# Patient Record
Sex: Female | Born: 1960 | Race: White | Hispanic: No | Marital: Married | Smoking: Current every day smoker
Health system: Southern US, Community
[De-identification: ages and names within clinical notes are randomized; demographics above are authoritative.]

## PROBLEM LIST (undated history)

## (undated) DIAGNOSIS — E785 Hyperlipidemia, unspecified: Secondary | ICD-10-CM

## (undated) DIAGNOSIS — J45909 Unspecified asthma, uncomplicated: Secondary | ICD-10-CM

## (undated) HISTORY — PX: NO PAST SURGERIES: SHX2092

## (undated) HISTORY — DX: Unspecified asthma, uncomplicated: J45.909

---

## 2018-11-11 ENCOUNTER — Encounter: Payer: Self-pay | Admitting: Emergency Medicine

## 2018-11-11 ENCOUNTER — Ambulatory Visit
Admission: EM | Admit: 2018-11-11 | Discharge: 2018-11-11 | Disposition: A | Discharge status: Home / Self Care | Payer: BLUE CROSS/BLUE SHIELD | Attending: Family Medicine | Admitting: Family Medicine

## 2018-11-11 ENCOUNTER — Other Ambulatory Visit: Payer: Self-pay

## 2018-11-11 DIAGNOSIS — J441 Chronic obstructive pulmonary disease with (acute) exacerbation: Secondary | ICD-10-CM

## 2018-11-11 DIAGNOSIS — J069 Acute upper respiratory infection, unspecified: Secondary | ICD-10-CM

## 2018-11-11 DIAGNOSIS — B9789 Other viral agents as the cause of diseases classified elsewhere: Secondary | ICD-10-CM

## 2018-11-11 HISTORY — DX: Hyperlipidemia, unspecified: E78.5

## 2018-11-11 MED ORDER — ALBUTEROL SULFATE HFA 108 (90 BASE) MCG/ACT IN AERS: 2.0000 | INHALATION_SPRAY | RESPIRATORY_TRACT | 0 refills | Status: DC | PRN

## 2018-11-11 MED ORDER — DOXYCYCLINE HYCLATE 100 MG PO CAPS: 100.0000 mg | ORAL_CAPSULE | Freq: Two times a day (BID) | ORAL | 0 refills | Status: DC

## 2018-11-11 MED ORDER — PREDNISONE 10 MG PO TABS: ORAL_TABLET | ORAL | 0 refills | Status: DC

## 2018-11-11 NOTE — Discharge Instructions (Addendum)
Take medication as prescribed. Rest. Drink plenty of fluids.   Follow up with your primary care physician this week as needed discussed Return to Urgent care for new or worsening concerns.

## 2018-11-11 NOTE — ED Triage Notes (Signed)
Patient c/o cough and chest congestion and runny nose off and on since October.  Patient denies fevers.

## 2018-11-11 NOTE — ED Provider Notes (Signed)
[facility] ____________________________________________  Time seen: Approximately 5:00 PM  I have reviewed the triage vital signs and the nursing notes.   HISTORY  Chief Complaint Cough   HPI Erin Stanton is a 57 y.o. female past medical history of asthma, COPD and hyperlipidemia presenting for evaluation of 1.5 weeks of cough and congestion complaints.  Patient states that her son came home for Thanksgiving and had a cold which she feels that she got sick from him.  Reports having nasal congestion, nasal drainage, productive cough with intermittent wheezing.  States she just ran out of her albuterol inhaler.  Denies any accompanying fevers.  Of note, patient also reports that she had a lasting cough from mid October 2 beginning of November.  States she was seen by her doctor and was treated with prednisone as well as a Z-Pak, and reports after that she had 2 to 3 weeks of symptom complete resolution.  States she did recover from previous and is now sick again.  Has tried some over-the-counter congestion medications without resolution.  Continues to eat and drink well.  Denies chest pain, shortness of breath, extremity swelling.  No accompanying fevers.  Denies other aggravating alleviating factors.  Reports otherwise doing well.  PCP: Basilia Jumbo   Past Medical History:  Diagnosis Date  . Hyperlipidemia     There are no active problems to display for this patient.   History reviewed. No pertinent surgical history.   No current facility-administered medications for this encounter.   Current Outpatient Medications:  .  albuterol (PROVENTIL HFA;VENTOLIN HFA) 108 (90 Base) MCG/ACT inhaler, Inhale 1-2 puffs into the lungs every 6 (six) hours as needed for wheezing or shortness of breath., Disp: , Rfl:  .  albuterol (PROVENTIL HFA;VENTOLIN HFA) 108 (90 Base) MCG/ACT inhaler, Inhale 2 puffs into the lungs every 4 (four) hours as needed., Disp: 1 Inhaler, Rfl: 0 .   doxycycline (VIBRAMYCIN) 100 MG capsule, Take 1 capsule (100 mg total) by mouth 2 (two) times daily., Disp: 20 capsule, Rfl: 0 .  pravastatin (PRAVACHOL) 10 MG tablet, Take 10 mg by mouth at bedtime., Disp: , Rfl: 1 .  predniSONE (DELTASONE) 10 MG tablet, Start 60 mg po day one, then 50 mg po day two, taper by 10 mg daily until complete., Disp: 21 tablet, Rfl: 0  Allergies Ceftin [cefuroxime]  Family History  Family history unknown: Yes    Social History Social History   Tobacco Use  . Smoking status: Current Every Day Smoker    Types: Cigarettes  . Smokeless tobacco: Never Used  Substance Use Topics  . Alcohol use: Yes  . Drug use: Never    Review of Systems Constitutional: No fever ENT: Initially had a sore throat that has since resolved and now only present with coughing. Cardiovascular: Denies chest pain. Respiratory: Denies shortness of breath. Gastrointestinal: No abdominal pain.  Musculoskeletal: Negative for back pain. Skin: Negative for rash.   ____________________________________________   PHYSICAL EXAM:  VITAL SIGNS: ED Triage Vitals  Enc Vitals Group     BP 11/11/18 1629 121/72     Pulse Rate 11/11/18 1629 78     Resp 11/11/18 1629 16     Temp 11/11/18 1629 98.4 F (36.9 C)     Temp Source 11/11/18 1629 Oral     SpO2 11/11/18 1629 96 %     Weight 11/11/18 1626 120 lb (54.4 kg)     Height 11/11/18 1626 5\' 8"  (1.727 m)     Head Circumference --  Peak Flow --      Pain Score 11/11/18 1626 0     Pain Loc --      Pain Edu? --      Excl. in GC? --     Constitutional: Alert and oriented. Well appearing and in no acute distress. Eyes: Conjunctivae are normal.  Head: Atraumatic. No sinus tenderness to palpation. No swelling. No erythema.  Ears: no erythema, normal TMs bilaterally.   Nose:Nasal congestion  Mouth/Throat: Mucous membranes are moist. No pharyngeal erythema. No tonsillar swelling or exudate.  Neck: No stridor.  No cervical spine  tenderness to palpation. Hematological/Lymphatic/Immunilogical: No cervical lymphadenopathy. Cardiovascular: Normal rate, regular rhythm. Grossly normal heart sounds.  Good peripheral circulation. Respiratory: Normal respiratory effort.  No retractions.  Mild scattered expiratory wheezes.  Mild scattered rhonchi.  Speaks in complete sentences.  Occasional cough in room.  Good air movement.  Musculoskeletal: Ambulatory with steady gait.  No lower extremity edema noted. Neurologic:  Normal speech and language. No gait instability. Skin:  Skin appears warm, dry and intact. No rash noted. Psychiatric: Mood and affect are normal. Speech and behavior are normal.  ___________________________________________   LABS (all labs ordered are listed, but only abnormal results are displayed)  Labs Reviewed - No data to display  PROCEDURES Procedures   INITIAL IMPRESSION / ASSESSMENT AND PLAN / ED COURSE  Pertinent labs & imaging results that were available during my care of the patient were reviewed by me and considered in my medical decision making (see chart for details).  Well-appearing patient.  No acute distress.  Suspect recent viral upper respiratory infection with COPD exacerbation.  Will treat with oral doxycycline, prednisone taper and refill albuterol inhaler.  Encouraged fluids, rest, supportive care as well as discussed strict follow-up and return parameters, follow-up with primary care in 1 to 2 weeks, sooner if no improvement.Discussed indication, risks and benefits of medications with patient.  Discussed follow up and return parameters including no resolution or any worsening concerns. Patient verbalized understanding and agreed to plan.   ____________________________________________   FINAL CLINICAL IMPRESSION(S) / ED DIAGNOSES  Final diagnoses:  COPD exacerbation (HCC)  Viral URI with cough     ED Discharge Orders         Ordered    albuterol (PROVENTIL HFA;VENTOLIN HFA)  108 (90 Base) MCG/ACT inhaler  Every 4 hours PRN     11/11/18 1650    doxycycline (VIBRAMYCIN) 100 MG capsule  2 times daily     11/11/18 1650    predniSONE (DELTASONE) 10 MG tablet     11/11/18 1650           Note: This dictation was prepared with Dragon dictation along with smaller phrase technology. Any transcriptional errors that result from this process are unintentional.         Renford DillsMiller, Devaney Segers, NP 11/11/18 1705

## 2019-09-04 ENCOUNTER — Other Ambulatory Visit: Payer: Self-pay | Admitting: Medical Oncology

## 2019-09-04 DIAGNOSIS — Z1231 Encounter for screening mammogram for malignant neoplasm of breast: Secondary | ICD-10-CM

## 2019-09-14 ENCOUNTER — Inpatient Hospital Stay: Admission: RE | Admit: 2019-09-14 | Payer: BLUE CROSS/BLUE SHIELD | Source: Ambulatory Visit

## 2019-10-04 ENCOUNTER — Ambulatory Visit: Payer: BC Managed Care – PPO

## 2019-12-19 ENCOUNTER — Encounter (INDEPENDENT_AMBULATORY_CARE_PROVIDER_SITE_OTHER): Payer: Self-pay

## 2019-12-19 ENCOUNTER — Other Ambulatory Visit: Payer: Self-pay

## 2019-12-19 ENCOUNTER — Ambulatory Visit
Admission: RE | Admit: 2019-12-19 | Discharge: 2019-12-19 | Disposition: A | Discharge status: Home / Self Care | Payer: BC Managed Care – PPO | Source: Ambulatory Visit | Attending: Medical Oncology | Admitting: Medical Oncology

## 2019-12-19 DIAGNOSIS — Z1231 Encounter for screening mammogram for malignant neoplasm of breast: Secondary | ICD-10-CM

## 2020-10-12 ENCOUNTER — Encounter: Payer: Self-pay | Admitting: Emergency Medicine

## 2020-10-12 ENCOUNTER — Ambulatory Visit
Admission: EM | Admit: 2020-10-12 | Discharge: 2020-10-12 | Disposition: A | Discharge status: Home / Self Care | Payer: BC Managed Care – PPO | Attending: Family Medicine | Admitting: Family Medicine

## 2020-10-12 ENCOUNTER — Ambulatory Visit (INDEPENDENT_AMBULATORY_CARE_PROVIDER_SITE_OTHER): Payer: BC Managed Care – PPO

## 2020-10-12 ENCOUNTER — Other Ambulatory Visit: Payer: Self-pay

## 2020-10-12 DIAGNOSIS — Z20822 Contact with and (suspected) exposure to covid-19: Secondary | ICD-10-CM | POA: Diagnosis not present

## 2020-10-12 DIAGNOSIS — Z7982 Long term (current) use of aspirin: Secondary | ICD-10-CM | POA: Diagnosis not present

## 2020-10-12 DIAGNOSIS — R509 Fever, unspecified: Secondary | ICD-10-CM

## 2020-10-12 DIAGNOSIS — F17210 Nicotine dependence, cigarettes, uncomplicated: Secondary | ICD-10-CM | POA: Diagnosis not present

## 2020-10-12 DIAGNOSIS — Z79899 Other long term (current) drug therapy: Secondary | ICD-10-CM | POA: Insufficient documentation

## 2020-10-12 DIAGNOSIS — J441 Chronic obstructive pulmonary disease with (acute) exacerbation: Secondary | ICD-10-CM | POA: Diagnosis not present

## 2020-10-12 DIAGNOSIS — R059 Cough, unspecified: Secondary | ICD-10-CM

## 2020-10-12 DIAGNOSIS — F17200 Nicotine dependence, unspecified, uncomplicated: Secondary | ICD-10-CM

## 2020-10-12 DIAGNOSIS — Z881 Allergy status to other antibiotic agents status: Secondary | ICD-10-CM | POA: Insufficient documentation

## 2020-10-12 MED ORDER — PREDNISONE 50 MG PO TABS: ORAL_TABLET | ORAL | 0 refills | Status: DC

## 2020-10-12 MED ORDER — HYDROCOD POLST-CPM POLST ER 10-8 MG/5ML PO SUER: 5.0000 mL | Freq: Two times a day (BID) | ORAL | 0 refills | Status: DC | PRN

## 2020-10-12 MED ORDER — ALBUTEROL SULFATE HFA 108 (90 BASE) MCG/ACT IN AERS: 1.0000 | INHALATION_SPRAY | Freq: Four times a day (QID) | RESPIRATORY_TRACT | 0 refills | Status: AC | PRN

## 2020-10-12 MED ORDER — AZITHROMYCIN 250 MG PO TABS: ORAL_TABLET | ORAL | 0 refills | Status: DC

## 2020-10-12 NOTE — ED Triage Notes (Signed)
Patient c/o cough and chest congestion for the past 4 days.  Patient also reports runny nose.  Patient denies recent fevers.

## 2020-10-12 NOTE — ED Provider Notes (Signed)
[facility]    CSN: [id number] Arrival date & time: 10/12/20  1239      History   Chief Complaint Chief Complaint  Patient presents with  . Cough   HPI   59 year old female presents with respiratory symptoms.  4 day history of symptoms. Reports productive cough and congestion. Has had fever as well, T-max 101.6.  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  No relieving factors.  She is most troubled by the cough.  She has known COPD as documented in the electronic medical record.  She states that she is out of her albuterol inhaler.  No other associated symptoms.  No other complaints.  Past Medical History:  Diagnosis Date  . Hyperlipidemia    Home Medications    Prior to Admission medications   Medication Sig Start Date End Date Taking? Authorizing Provider  aspirin 81 MG EC tablet Take by mouth.   Yes [provider]  pravastatin (PRAVACHOL) 10 MG tablet Take 10 mg by mouth at bedtime. 10/04/18  Yes [provider]  albuterol (VENTOLIN HFA) 108 (90 Base) MCG/ACT inhaler Inhale 1-2 puffs into the lungs every 6 (six) hours as needed for wheezing or shortness of breath. 10/12/20   Tommie Sams, DO  azithromycin (ZITHROMAX) 250 MG tablet 2 tablets on day 1, then 1 tablet daily on days 2-5. 10/12/20   Tommie Sams, DO  chlorpheniramine-HYDROcodone (TUSSIONEX PENNKINETIC ER) 10-8 MG/5ML SUER Take 5 mLs by mouth every 12 (twelve) hours as needed for cough. 10/12/20   Tommie Sams, DO  predniSONE (DELTASONE) 50 MG tablet 1 tablet daily x 5 days 10/12/20   Tommie Sams, DO    Family History Family History  Problem Relation Age of Onset  . Breast cancer Father   . Breast cancer Sister   . Breast cancer Paternal Aunt   . Breast cancer Cousin     Social History Social History   Tobacco Use  . Smoking status: Current Every Day Smoker    Types: Cigarettes  . Smokeless tobacco: Never Used  Vaping Use  . Vaping Use: Never used    Substance Use Topics  . Alcohol use: Yes  . Drug use: Never     Allergies   Ceftin [cefuroxime]   Review of Systems Review of Systems  Constitutional: Positive for fever.  HENT: Positive for congestion.   Respiratory: Positive for cough.    Physical Exam Triage Vital Signs ED Triage Vitals  Enc Vitals Group     BP 10/12/20 1316 133/79     Pulse Rate 10/12/20 1316 100     Resp 10/12/20 1316 16     Temp 10/12/20 1316 98.7 F (37.1 C)     Temp Source 10/12/20 1316 Oral     SpO2 10/12/20 1316 98 %     Weight 10/12/20 1312 120 lb (54.4 kg)     Height 10/12/20 1312 5\' 8"  (1.727 m)     Head Circumference --      Peak Flow --      Pain Score 10/12/20 1311 0     Pain Loc --      Pain Edu? --      Excl. in GC? --    Updated Vital Signs BP 133/79 (BP Location: Right Arm)   Pulse 100   Temp 98.7 F (37.1 C) (Oral)   Resp 16   Ht 5\' 8"  (1.727 m)   Wt 54.4 kg  SpO2 98%   BMI 18.25 kg/m   Visual Acuity Right Eye Distance:   Left Eye Distance:   Bilateral Distance:    Right Eye Near:   Left Eye Near:    Bilateral Near:     Physical Exam Vitals and nursing note reviewed.  Constitutional:      General: She is not in acute distress.    Appearance: Normal appearance. She is not ill-appearing.  HENT:     Head: Normocephalic and atraumatic.  Eyes:     General:        Right eye: No discharge.        Left eye: No discharge.     Conjunctiva/sclera: Conjunctivae normal.  Cardiovascular:     Rate and Rhythm: Regular rhythm. Tachycardia present.  Pulmonary:     Effort: Pulmonary effort is normal.     Breath sounds: Normal breath sounds. No wheezing or rales.  Neurological:     Mental Status: She is alert.  Psychiatric:        Mood and Affect: Mood normal.        Behavior: Behavior normal.    UC Treatments / Results  Labs (all labs ordered are listed, but only abnormal results are displayed) Labs Reviewed  SARS CORONAVIRUS 2 (TAT 6-24 HRS)     EKG   Radiology DG Chest 2 View  Result Date: 10/12/2020 CLINICAL DATA:  Cough, fever, current smoker EXAM: CHEST - 2 VIEW COMPARISON:  None. FINDINGS: Normal heart size. Normal mediastinal contour. No pneumothorax. No pleural effusion. Mild symmetric biapical pleural-parenchymal scarring. No pulmonary edema. No acute consolidative airspace disease. IMPRESSION: No active cardiopulmonary disease. Electronically Signed   By: Delbert Phenix M.D.   On: 10/12/2020 14:33    Procedures Procedures (including critical care time)  Medications Ordered in UC Medications - No data to display  Initial Impression / Assessment and Plan / UC Course  I have reviewed the triage vital signs and the nursing notes.  Pertinent labs & imaging results that were available during my care of the patient were reviewed by me and considered in my medical decision making (see chart for details).    59 year old female presents with COPD exacerbation.  Chest x-ray was obtained and was independently reviewed by me.  No acute infiltrate.  Treating with prednisone, albuterol, azithromycin.  Tussionex as needed for cough.  Final Clinical Impressions(s) / UC Diagnoses   Final diagnoses:  COPD exacerbation [facility])   Discharge Instructions   None    ED Prescriptions    Medication Sig Dispense Auth. Provider   predniSONE (DELTASONE) 50 MG tablet 1 tablet daily x 5 days 5 tablet Anthem Frazer G, DO   albuterol (VENTOLIN HFA) 108 (90 Base) MCG/ACT inhaler Inhale 1-2 puffs into the lungs every 6 (six) hours as needed for wheezing or shortness of breath. 18 g Krishawn Vanderweele G, DO   azithromycin (ZITHROMAX) 250 MG tablet 2 tablets on day 1, then 1 tablet daily on days 2-5. 6 tablet Dorenda Pfannenstiel G, DO   chlorpheniramine-HYDROcodone (TUSSIONEX PENNKINETIC ER) 10-8 MG/5ML SUER Take 5 mLs by mouth every 12 (twelve) hours as needed for cough. 115 mL Tommie Sams, DO     PDMP not reviewed this encounter.   Tommie Sams,  [place] 10/12/20 1546

## 2020-10-13 LAB — SARS CORONAVIRUS 2 (TAT 6-24 HRS): SARS Coronavirus 2: NEGATIVE

## 2021-01-24 ENCOUNTER — Other Ambulatory Visit: Payer: Self-pay

## 2021-01-24 DIAGNOSIS — Z1231 Encounter for screening mammogram for malignant neoplasm of breast: Secondary | ICD-10-CM

## 2021-02-18 ENCOUNTER — Other Ambulatory Visit: Payer: Self-pay | Admitting: Family Medicine

## 2021-02-24 ENCOUNTER — Other Ambulatory Visit: Payer: Self-pay

## 2021-02-24 ENCOUNTER — Ambulatory Visit
Admission: RE | Admit: 2021-02-24 | Discharge: 2021-02-24 | Disposition: A | Discharge status: Home / Self Care | Payer: BC Managed Care – PPO | Source: Ambulatory Visit

## 2021-02-24 DIAGNOSIS — Z1231 Encounter for screening mammogram for malignant neoplasm of breast: Secondary | ICD-10-CM | POA: Diagnosis present

## 2021-03-06 ENCOUNTER — Other Ambulatory Visit: Payer: Self-pay | Admitting: Family Medicine

## 2021-03-08 ENCOUNTER — Other Ambulatory Visit: Payer: Self-pay | Admitting: Family Medicine

## 2021-03-13 ENCOUNTER — Encounter: Payer: Self-pay | Admitting: Obstetrics and Gynecology

## 2021-03-13 ENCOUNTER — Ambulatory Visit (INDEPENDENT_AMBULATORY_CARE_PROVIDER_SITE_OTHER): Payer: BC Managed Care – PPO | Admitting: Obstetrics and Gynecology

## 2021-03-13 ENCOUNTER — Other Ambulatory Visit: Payer: Self-pay

## 2021-03-13 ENCOUNTER — Other Ambulatory Visit (HOSPITAL_COMMUNITY)
Admission: RE | Admit: 2021-03-13 | Discharge: 2021-03-13 | Disposition: A | Discharge status: Home / Self Care | Payer: BC Managed Care – PPO | Source: Ambulatory Visit | Attending: Obstetrics and Gynecology | Admitting: Obstetrics and Gynecology

## 2021-03-13 VITALS — BP 113/72 | Ht 68.0 in | Wt 124.0 lb

## 2021-03-13 DIAGNOSIS — Z1339 Encounter for screening examination for other mental health and behavioral disorders: Secondary | ICD-10-CM

## 2021-03-13 DIAGNOSIS — Z1331 Encounter for screening for depression: Secondary | ICD-10-CM | POA: Diagnosis not present

## 2021-03-13 DIAGNOSIS — Z124 Encounter for screening for malignant neoplasm of cervix: Secondary | ICD-10-CM | POA: Diagnosis present

## 2021-03-13 DIAGNOSIS — Z01419 Encounter for gynecological examination (general) (routine) without abnormal findings: Secondary | ICD-10-CM | POA: Insufficient documentation

## 2021-03-13 DIAGNOSIS — Z803 Family history of malignant neoplasm of breast: Secondary | ICD-10-CM

## 2021-03-13 NOTE — Progress Notes (Signed)
Routine Annual Gynecology Examination   PCP: D, Ples Specter, FNP  Chief Complaint  Patient presents with  . Annual Exam   History of Present Illness: Patient is a 60 y.o. G1P1001 presents for annual exam. The patient has no complaints today.   Menopausal bleeding: denies  Menopausal symptoms: denies  Breast symptoms: denies  Last pap smear: 03/2015 years ago.  Result ASCUS, no HPV performed  Last mammogram: 02/24/2021 - BiRads 1  She states that she has had BRCA testing due to family history on her father's side of the family.  She believes the testing was done in 2012.    Past Medical History:  Diagnosis Date  . Asthma    flare up when gets sick  . Hyperlipidemia     Past Surgical History:  Procedure Laterality Date  . NO PAST SURGERIES      Prior to Admission medications   Medication Sig Start Date End Date Taking? Authorizing Provider  albuterol (VENTOLIN HFA) 108 (90 Base) MCG/ACT inhaler Inhale 1-2 puffs into the lungs every 6 (six) hours as needed for wheezing or shortness of breath. 10/12/20  Yes Cook, Jayce G, DO  pravastatin (PRAVACHOL) 10 MG tablet Take 10 mg by mouth at bedtime. 10/04/18  Yes [provider]    Allergies  Allergen Reactions  . Ceftin [Cefuroxime] Anaphylaxis    Obstetric History: G1P1001  Social History   Socioeconomic History  . Marital status: Married    Spouse name: Not on file  . Number of children: Not on file  . Years of education: Not on file  . Highest education level: Not on file  Occupational History  . Not on file  Tobacco Use  . Smoking status: Current Every Day Smoker    Packs/day: 1.00    Years: 41.00    Pack years: 41.00    Types: Cigarettes  . Smokeless tobacco: Never Used  Vaping Use  . Vaping Use: Never used  Substance and Sexual Activity  . Alcohol use: Yes    Comment: 1 per day  . Drug use: Never  . Sexual activity: Yes    Birth control/protection: Post-menopausal  Other Topics Concern   . Not on file  Social History Narrative  . Not on file   Social Determinants of Health   Financial Resource Strain: Not on file  Food Insecurity: Not on file  Transportation Needs: Not on file  Physical Activity: Not on file  Stress: Not on file  Social Connections: Not on file  Intimate Partner Violence: Not on file    Family History  Problem Relation Age of Onset  . Breast cancer Father   . Breast cancer Sister   . Breast cancer Paternal Aunt   . Ovarian cancer Paternal Aunt   . Breast cancer Cousin   . Diabetes Sister   . Breast cancer Half-Sister     Review of Systems  Constitutional: Negative.   HENT: Negative.   Eyes: Negative.   Respiratory: Negative.   Cardiovascular: Negative.   Gastrointestinal: Negative.   Genitourinary: Negative.   Musculoskeletal: Negative.   Skin: Negative.   Neurological: Negative.   Psychiatric/Behavioral: Negative.      Physical Exam Vitals: BP 113/72   Ht 5' 8"  (1.727 m)   Wt 124 lb (56.2 kg)   BMI 18.85 kg/m   Physical Exam Constitutional:      General: She is not in acute distress.    Appearance: Normal appearance. She is well-developed.  Genitourinary:  Vulva and bladder normal.     Right Labia: No rash, tenderness, lesions, skin changes or Bartholin's cyst.    Left Labia: No tenderness, lesions, skin changes, Bartholin's cyst or rash.    No inguinal adenopathy present in the right or left side.    Pelvic Tanner Score: 5/5.    No vaginal discharge, erythema, tenderness or bleeding.      Right Adnexa: not tender, not full and no mass present.    Left Adnexa: not tender, not full and no mass present.    No cervical motion tenderness, discharge, lesion or polyp.     Uterus is not enlarged or tender.     No uterine mass detected.    Pelvic exam was performed with patient in the lithotomy position.  HENT:     Head: Normocephalic and atraumatic.  Eyes:     General: No scleral icterus.    Conjunctiva/sclera:  Conjunctivae normal.  Neck:     Thyroid: No thyromegaly.  Cardiovascular:     Rate and Rhythm: Normal rate and regular rhythm.     Heart sounds: No murmur heard. No friction rub. No gallop.   Pulmonary:     Effort: Pulmonary effort is normal. No respiratory distress.     Breath sounds: Normal breath sounds. No wheezing or rales.  Abdominal:     General: Bowel sounds are normal. There is no distension.     Palpations: Abdomen is soft. There is no mass.     Tenderness: There is no abdominal tenderness. There is no guarding or rebound.     Hernia: There is no hernia in the left inguinal area or right inguinal area.  Musculoskeletal:        General: No swelling or tenderness. Normal range of motion.     Cervical back: Normal range of motion and neck supple.  Lymphadenopathy:     Cervical: No cervical adenopathy.     Lower Body: No right inguinal adenopathy. No left inguinal adenopathy.  Neurological:     General: No focal deficit present.     Mental Status: She is alert and oriented to person, place, and time.     Cranial Nerves: No cranial nerve deficit.  Skin:    General: Skin is warm and dry.     Findings: No erythema or rash.  Psychiatric:        Mood and Affect: Mood normal.        Behavior: Behavior normal.        Judgment: Judgment normal.    breast exam deferred by patient. She states that her PCP did this exam recently.   Female chaperone present for pelvic and breast  portions of the physical exam  Tyrer-Cuzik = 21.9%  Assessment and Plan:  60 y.o. G38P1001 female here for routine annual gynecologic examination  Plan: Problem List Items Addressed This Visit      Other   Family history of breast cancer    Other Visit Diagnoses    Women's annual routine gynecological examination    -  Primary   Relevant Orders   Cytology - PAP   Screening for depression       Screening for alcoholism       Pap smear for cervical cancer screening       Relevant Orders    Cytology - PAP     Screening: -- Blood pressure screen normal -- Colonoscopy - due - managed by PCP -- Mammogram - not due -- Weight screening: normal --  Depression screening negative (PHQ-9) -- Nutrition: normal -- cholesterol screening: per PCP -- osteoporosis screening: not due -- tobacco screening: using: discussed quitting using the 5 A's -- alcohol screening: AUDIT questionnaire indicates low-risk usage. -- family history of breast cancer screening: done. not at high risk. -- no evidence of domestic violence or intimate partner violence. -- STD screening: gonorrhea/chlamydia NAAT not collected per patient request. -- pap smear collected per ASCCP guidelines -- Lung cancer screening: s/p chest CT  Family history of breast cancer on her father's side: we discussed update testing, given how long it has been since her last test.  She will consider.  We discussed her Mat [facility]ne results and the fact that this will change recommendations for surveillance.  I provided her a copy of her results so that she may discuss these with her primary care provider who is performing her breast exams.  If she would like for me to manage this, I would be happy to do so.   Prentice Docker, MD 03/13/2021 5:21 PM

## 2021-03-18 LAB — CYTOLOGY - PAP
Comment: NEGATIVE
Diagnosis: NEGATIVE
High risk HPV: NEGATIVE

## 2021-03-27 ENCOUNTER — Encounter: Payer: Self-pay | Admitting: Obstetrics and Gynecology

## 2021-12-04 IMAGING — MG DIGITAL SCREENING BILAT W/ TOMO W/ CAD
8 series · 9 of 24 positions shown · non-contrast
Comparison: Previous exam(s).

CLINICAL DATA: Screening.

EXAM:
DIGITAL SCREENING BILATERAL MAMMOGRAM WITH TOMO AND CAD

[L CC synth-2D]
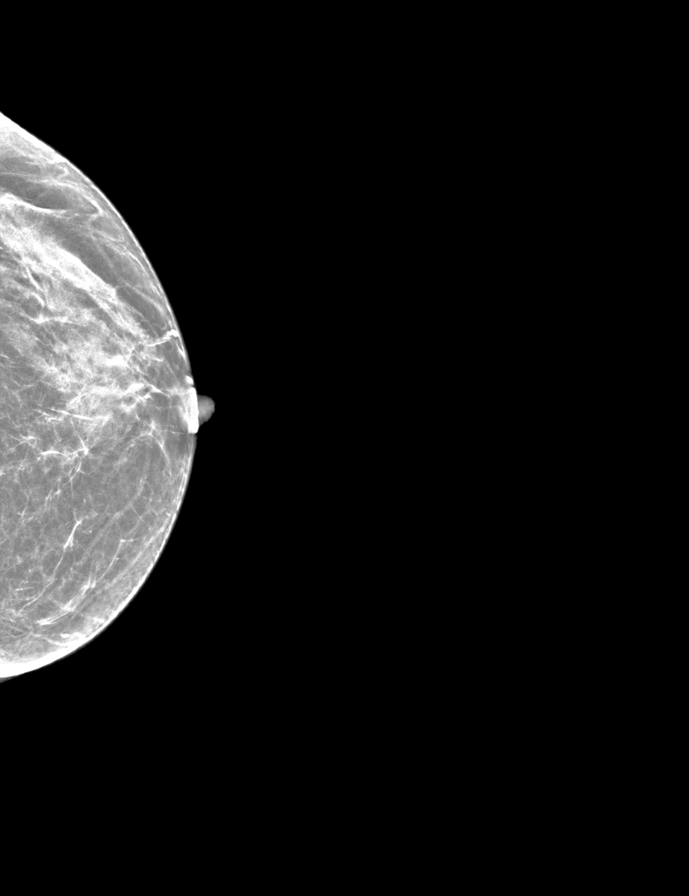

[L MLO synth-2D]
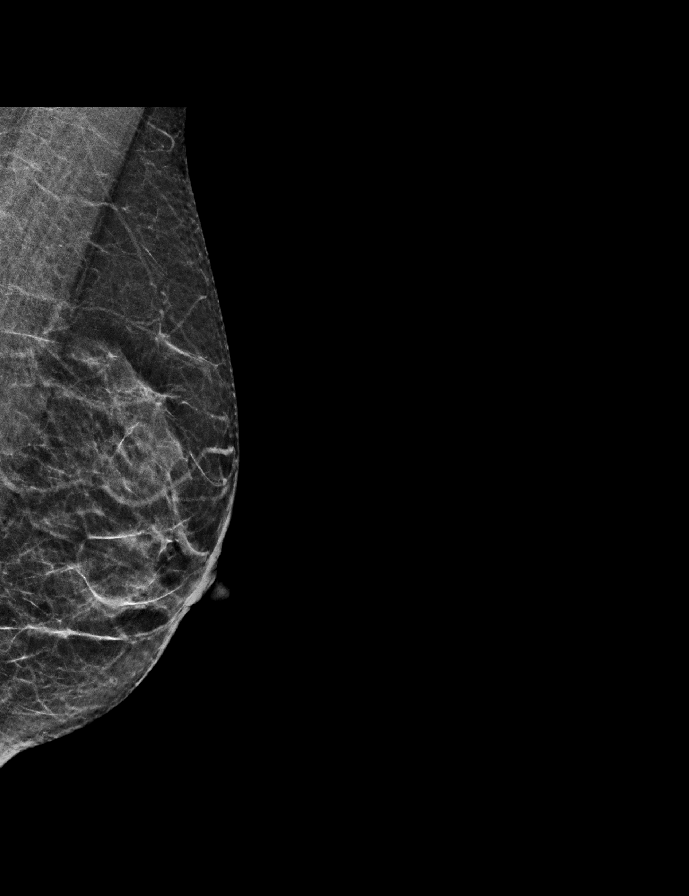

[R MLO synth-2D]
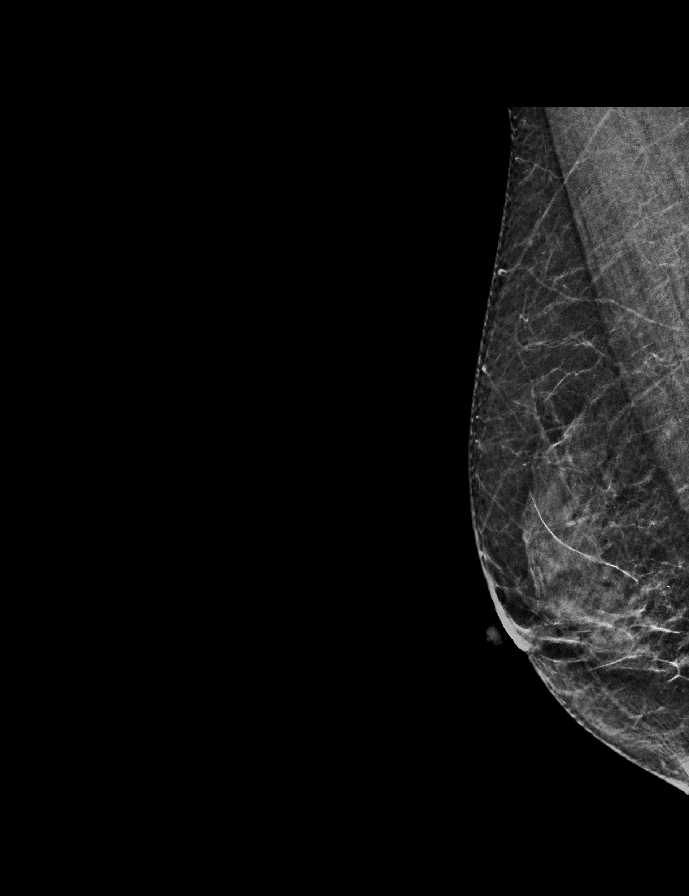

[R CC synth-2D]
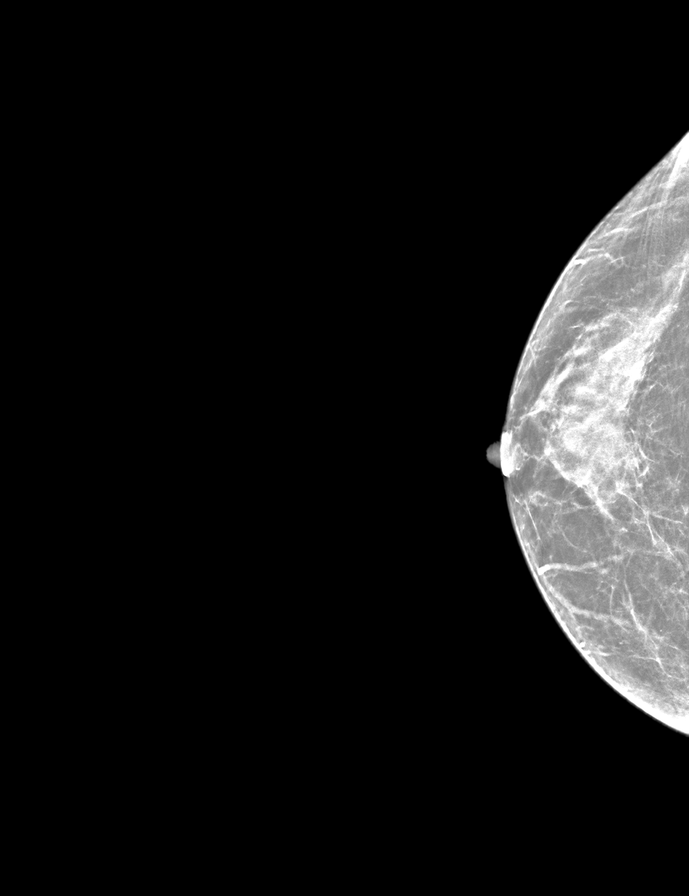

[R CC tomo · 2 of 51 frames shown]
[frame 17/51]
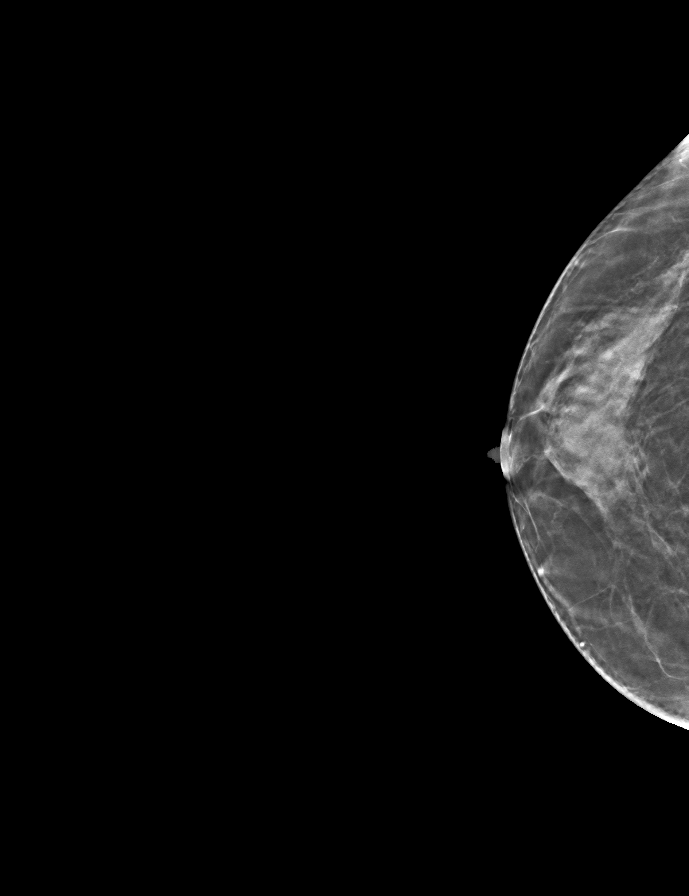
[frame 26/51]
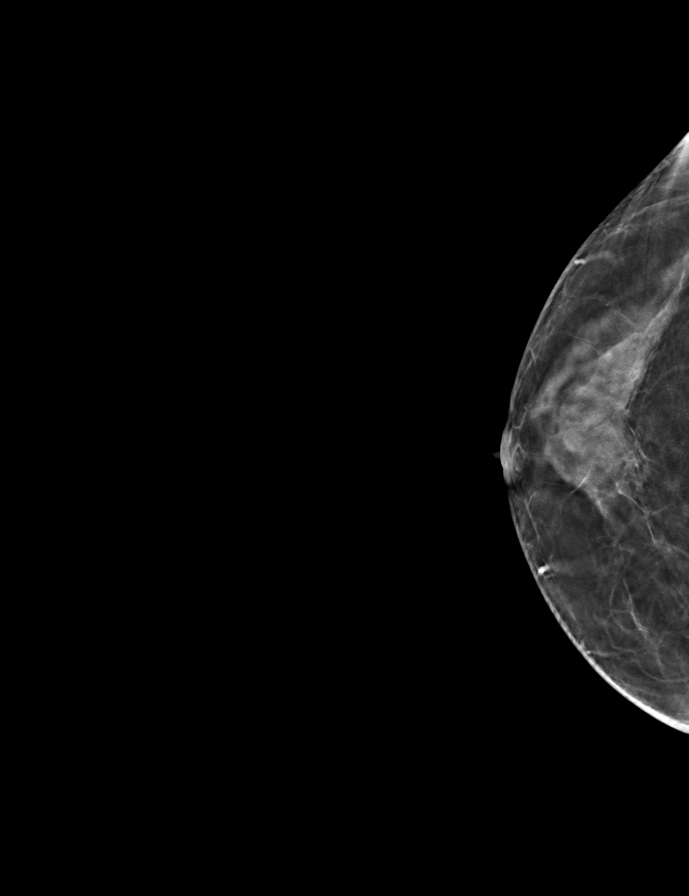

[L CC tomo · tomo slice 24/47.0]
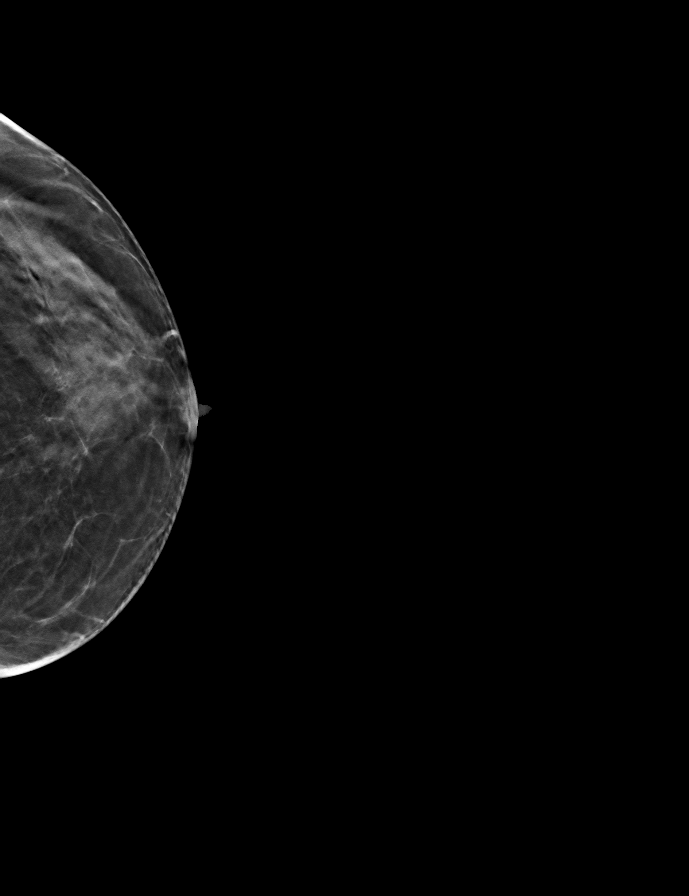

[R MLO tomo · tomo slice 23/44.0]
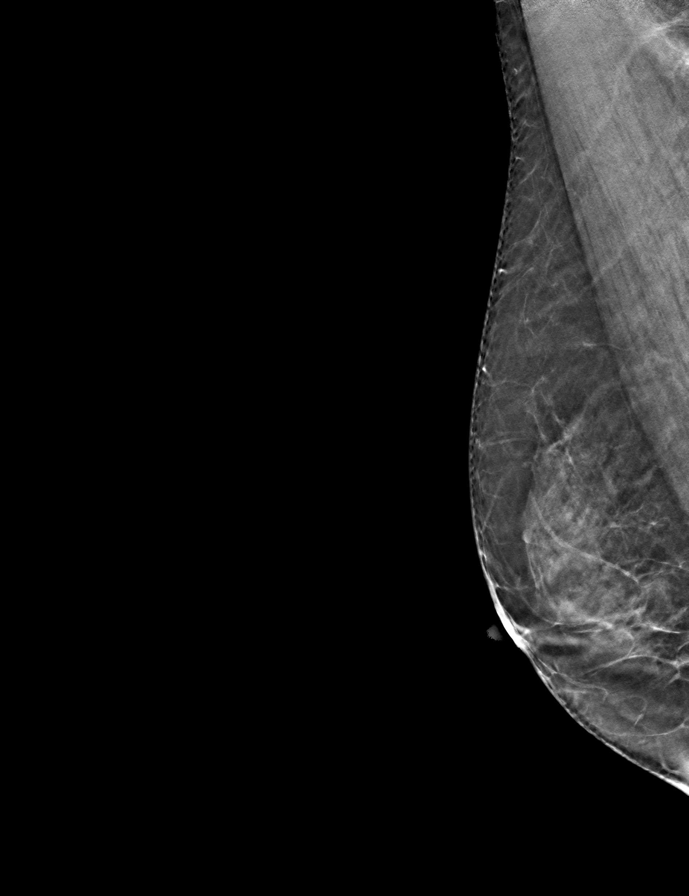

[L MLO tomo · tomo slice 23/46.0]
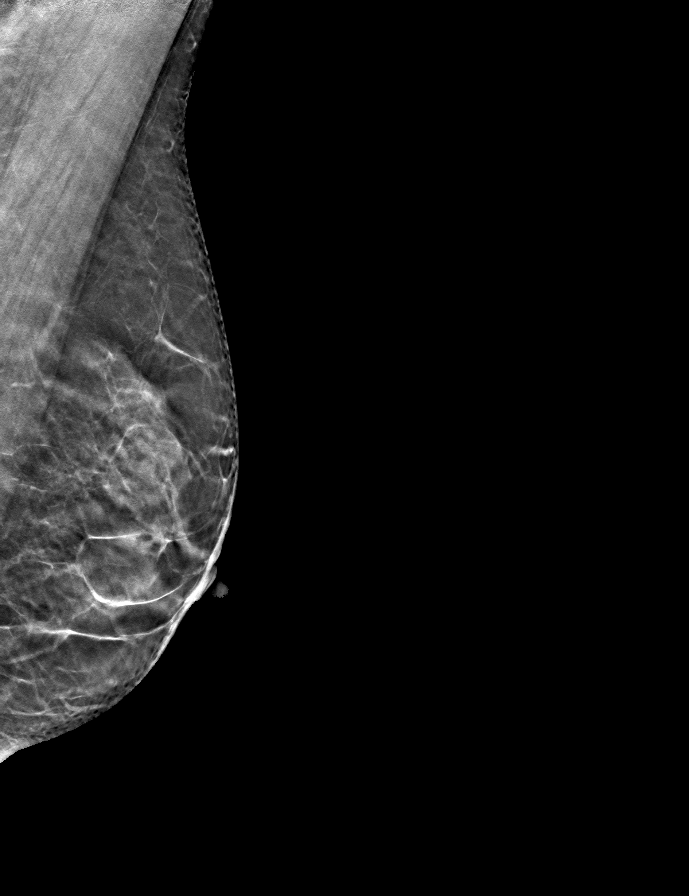

[9 of 24 positions shown; findings below may reference images not displayed]

ACR Breast Density Category c: The breast tissue is heterogeneously
dense, which may obscure small masses.
FINDINGS: There are no findings suspicious for malignancy. Images were
processed with CAD.
IMPRESSION: No mammographic evidence of malignancy. A result letter of this
screening mammogram will be mailed directly to the patient.

RECOMMENDATION:
Screening mammogram in one year. (Code:FT-U-LHB)

BI-RADS CATEGORY  1: Negative.

## 2022-01-21 ENCOUNTER — Ambulatory Visit
Admission: EM | Admit: 2022-01-21 | Discharge: 2022-01-21 | Disposition: A | Discharge status: Home / Self Care | Payer: BC Managed Care – PPO | Attending: Emergency Medicine | Admitting: Emergency Medicine

## 2022-01-21 ENCOUNTER — Other Ambulatory Visit: Payer: Self-pay

## 2022-01-21 DIAGNOSIS — J069 Acute upper respiratory infection, unspecified: Secondary | ICD-10-CM | POA: Diagnosis not present

## 2022-01-21 MED ORDER — PREDNISONE 10 MG PO TABS: ORAL_TABLET | ORAL | 0 refills | Status: AC

## 2022-01-21 MED ORDER — AMOXICILLIN-POT CLAVULANATE 875-125 MG PO TABS: 1.0000 | ORAL_TABLET | Freq: Two times a day (BID) | ORAL | 0 refills | Status: AC

## 2022-01-21 MED ORDER — PROMETHAZINE-DM 6.25-15 MG/5ML PO SYRP: 5.0000 mL | ORAL_SOLUTION | Freq: Four times a day (QID) | ORAL | 0 refills | Status: AC | PRN

## 2022-01-21 NOTE — ED Triage Notes (Signed)
Pt here with C/O cough for 12 days. Cough is productive.  Pt states at times she does wheeze.

## 2022-01-21 NOTE — ED Provider Notes (Signed)
None [facility]    CSN: [id number] Arrival date & time: 01/21/22  1525      History   Chief Complaint Chief Complaint  Patient presents with   Cough    HPI Doralyn Kirkes is a 61 y.o. female.   Patient presents with fever, bilateral ear fullness, rhinorrhea, sinus pressure, productive cough, increased shortness of breath and wheezing and generalized headaches for 12 days.  Cough is worse at nighttime.  Exposure to possible sick contacts as she works at a [employer] claims [employer].  Decreased appetite but tolerating some food and fluids.  Has attempted use of Mucinex, Robitussin, Benadryl, atropine nasal spray and albuterol inhaler which has been somewhat helpful.  History of asthma, COPD.  Daily smoker.    Past Medical History:  Diagnosis Date   Asthma    flare up when gets sick   Hyperlipidemia     Patient Active Problem List   Diagnosis Date Noted   Family history of breast cancer 03/13/2021    Past Surgical History:  Procedure Laterality Date   NO PAST SURGERIES      OB History     Gravida  1   Para  1   Term  1   Preterm      AB      Living  1      SAB      IAB      Ectopic      Multiple      Live Births  1            Home Medications    Prior to Admission medications   Medication Sig Start Date End Date Taking? Authorizing Provider  albuterol (VENTOLIN HFA) 108 (90 Base) MCG/ACT inhaler Inhale 1-2 puffs into the lungs every 6 (six) hours as needed for wheezing or shortness of breath. 10/12/20  Yes Cook, Jayce G, DO  aspirin 81 MG EC tablet Take by mouth.   Yes [provider]  pravastatin (PRAVACHOL) 10 MG tablet Take 10 mg by mouth at bedtime. 10/04/18  Yes [provider]  azithromycin (ZITHROMAX) 250 MG tablet 2 tablets on day 1, then 1 tablet daily on days 2-5. Patient not taking: Reported on 03/13/2021 10/12/20   Tommie Sams, DO  chlorpheniramine-HYDROcodone [facility]) 10-8 MG/5ML SUER Take 5 mLs  by mouth every 12 (twelve) hours as needed for cough. Patient not taking: Reported on 03/13/2021 10/12/20   Tommie Sams, DO  predniSONE (DELTASONE) 50 MG tablet 1 tablet daily x 5 days Patient not taking: Reported on 03/13/2021 10/12/20   Tommie Sams, DO    Family History Family History  Problem Relation Age of Onset   Breast cancer Father    Breast cancer Sister    Breast cancer Paternal Aunt    Ovarian cancer Paternal Aunt    Breast cancer Cousin    Diabetes Sister    Breast cancer Half-Sister     Social History Social History   Tobacco Use   Smoking status: Every Day    Packs/day: 1.00    Years: 41.00    Pack years: 41.00    Types: Cigarettes   Smokeless tobacco: Never  Vaping Use   Vaping Use: Never used  Substance Use Topics   Alcohol use: Yes    Comment: 1 per day   Drug use: Never     Allergies   Ceftin [cefuroxime]   Review of Systems Review of Systems  Constitutional:  Positive  for fever. Negative for activity change, appetite change, chills, diaphoresis, fatigue and unexpected weight change.  HENT:  Positive for ear pain, rhinorrhea and sinus pressure. Negative for congestion, dental problem, drooling, ear discharge, facial swelling, hearing loss, mouth sores, nosebleeds, postnasal drip, sinus pain, sneezing, sore throat, tinnitus, trouble swallowing and voice change.   Respiratory:  Positive for cough, shortness of breath and wheezing. Negative for apnea, choking, chest tightness and stridor.   Cardiovascular: Negative.   Gastrointestinal: Negative.  Negative for abdominal distention.  Skin: Negative.   Neurological:  Positive for headaches. Negative for dizziness, tremors, seizures, syncope, facial asymmetry, speech difficulty, weakness, light-headedness and numbness.    Physical Exam Triage Vital Signs ED Triage Vitals  Enc Vitals Group     BP 01/21/22 1620 128/70     Pulse Rate 01/21/22 1620 84     Resp 01/21/22 1620 18     Temp 01/21/22 1620  99.3 F (37.4 C)     Temp Source 01/21/22 1620 Oral     SpO2 01/21/22 1620 98 %     Weight 01/21/22 1619 120 lb (54.4 kg)     Height 01/21/22 1619 5\' 8"  (1.727 m)     Head Circumference --      Peak Flow --      Pain Score 01/21/22 1619 0     Pain Loc --      Pain Edu? --      Excl. in GC? --    No data found.  Updated Vital Signs BP 128/70 (BP Location: Left Arm)    Pulse 84    Temp 99.3 F (37.4 C) (Oral)    Resp 18    Ht 5\' 8"  (1.727 m)    Wt 120 lb (54.4 kg)    SpO2 98%    BMI 18.25 kg/m   Visual Acuity Right Eye Distance:   Left Eye Distance:   Bilateral Distance:    Right Eye Near:   Left Eye Near:    Bilateral Near:     Physical Exam Constitutional:      Appearance: Normal appearance.  HENT:     Head: Normocephalic.     Right Ear: Tympanic membrane, ear canal and external ear normal.     Left Ear: Tympanic membrane, ear canal and external ear normal.     Nose: Congestion and rhinorrhea present.     Mouth/Throat:     Mouth: Mucous membranes are moist.     Pharynx: Posterior oropharyngeal erythema present.  Eyes:     Extraocular Movements: Extraocular movements intact.  Cardiovascular:     Rate and Rhythm: Normal rate and regular rhythm.     Pulses: Normal pulses.     Heart sounds: Normal heart sounds.  Pulmonary:     Effort: Pulmonary effort is normal.     Breath sounds: Wheezing present.  Musculoskeletal:     Cervical back: Normal range of motion and neck supple.  Skin:    General: Skin is warm and dry.  Neurological:     Mental Status: She is alert and oriented to person, place, and time. Mental status is at baseline.  Psychiatric:        Mood and Affect: Mood normal.        Behavior: Behavior normal.     UC Treatments / Results  Labs (all labs ordered are listed, but only abnormal results are displayed) Labs Reviewed - No data to display  EKG   Radiology No results found.  Procedures  Procedures (including critical care  time)  Medications Ordered in UC Medications - No data to display  Initial Impression / Assessment and Plan / UC Course  I have reviewed the triage vital signs and the nursing notes.  Pertinent labs & imaging results that were available during my care of the patient were reviewed by me and considered in my medical decision making (see chart for details).  Viral URI with cough  Vital signs are stable and patient is in no signs of distress, O2 saturation 98% on room air with a low-grade fever of 99.3 in triage, wheezing to auscultation, discussed findings with patient etiology of symptoms are most likely viral however due to history and symptoms progressing to approximately 2 weeks will prophylactically treat for bronchitis without imaging, discussed with patient, in agreement with plan of care, will defer use of azithromycin due to recent use in December, Augmentin 7-day course prescribed and stated, prednisone 60 mg taper prescribed as well as Promethazine DM to help with coughing, patient may follow-up with urgent care or primary care doctor for further evaluation as needed Final Clinical Impressions(s) / UC Diagnoses   Final diagnoses:  None   Discharge Instructions   None    ED Prescriptions   None    PDMP not reviewed this encounter.   Valinda Hoar, NP 01/21/22 1721

## 2022-01-21 NOTE — Discharge Instructions (Signed)
Your symptoms today are most likely being caused by a virus and should steadily improve in time it can take up to 7 to 10 days before you truly start to see a turnaround however things will get better  Due to your history of COPD, asthma and the timeline of your illness today you are being treated prophylactically for bronchitis  We will use Augmentin due to your recent use of azithromycin, take Augmentin twice a day for the next 7 days  Starting tomorrow begin prednisone every morning with food as directed on packaging  You may use cough syrup every 6 hours as needed, be mindful of this medication option may make you drowsy, if this occurs she may use at bedtime  If you have concerns about excessive runny nose, decrease your use of Mucinex (guaifenesin, Robitussin) as this is what that medicine will make happen  May attempt any of the following below    You can take Tylenol and/or Ibuprofen as needed for fever reduction and pain relief.   For cough: honey 1/2 to 1 teaspoon (you can dilute the honey in water or another fluid). You can use a humidifier for chest congestion and cough.  If you don't have a humidifier, you can sit in the bathroom with the hot shower running.      For sore throat: try warm salt water gargles, cepacol lozenges, throat spray, warm tea or water with lemon/honey, popsicles or ice, or OTC cold relief medicine for throat discomfort.   For congestion: take a daily anti-histamine like Zyrtec, Claritin, and a oral decongestant, such as pseudoephedrine.  You can also use Flonase 1-2 sprays in each nostril daily.   It is important to stay hydrated: drink plenty of fluids (water, gatorade/powerade/pedialyte, juices, or teas) to keep your throat moisturized and help further relieve irritation/discomfort.

## 2022-09-28 IMAGING — CR DG CHEST 2V
2 series · 2 of 2 positions shown · non-contrast
Comparison: None.

CLINICAL DATA: Cough, fever, current smoker

EXAM:
CHEST - 2 VIEW

[chest pa]
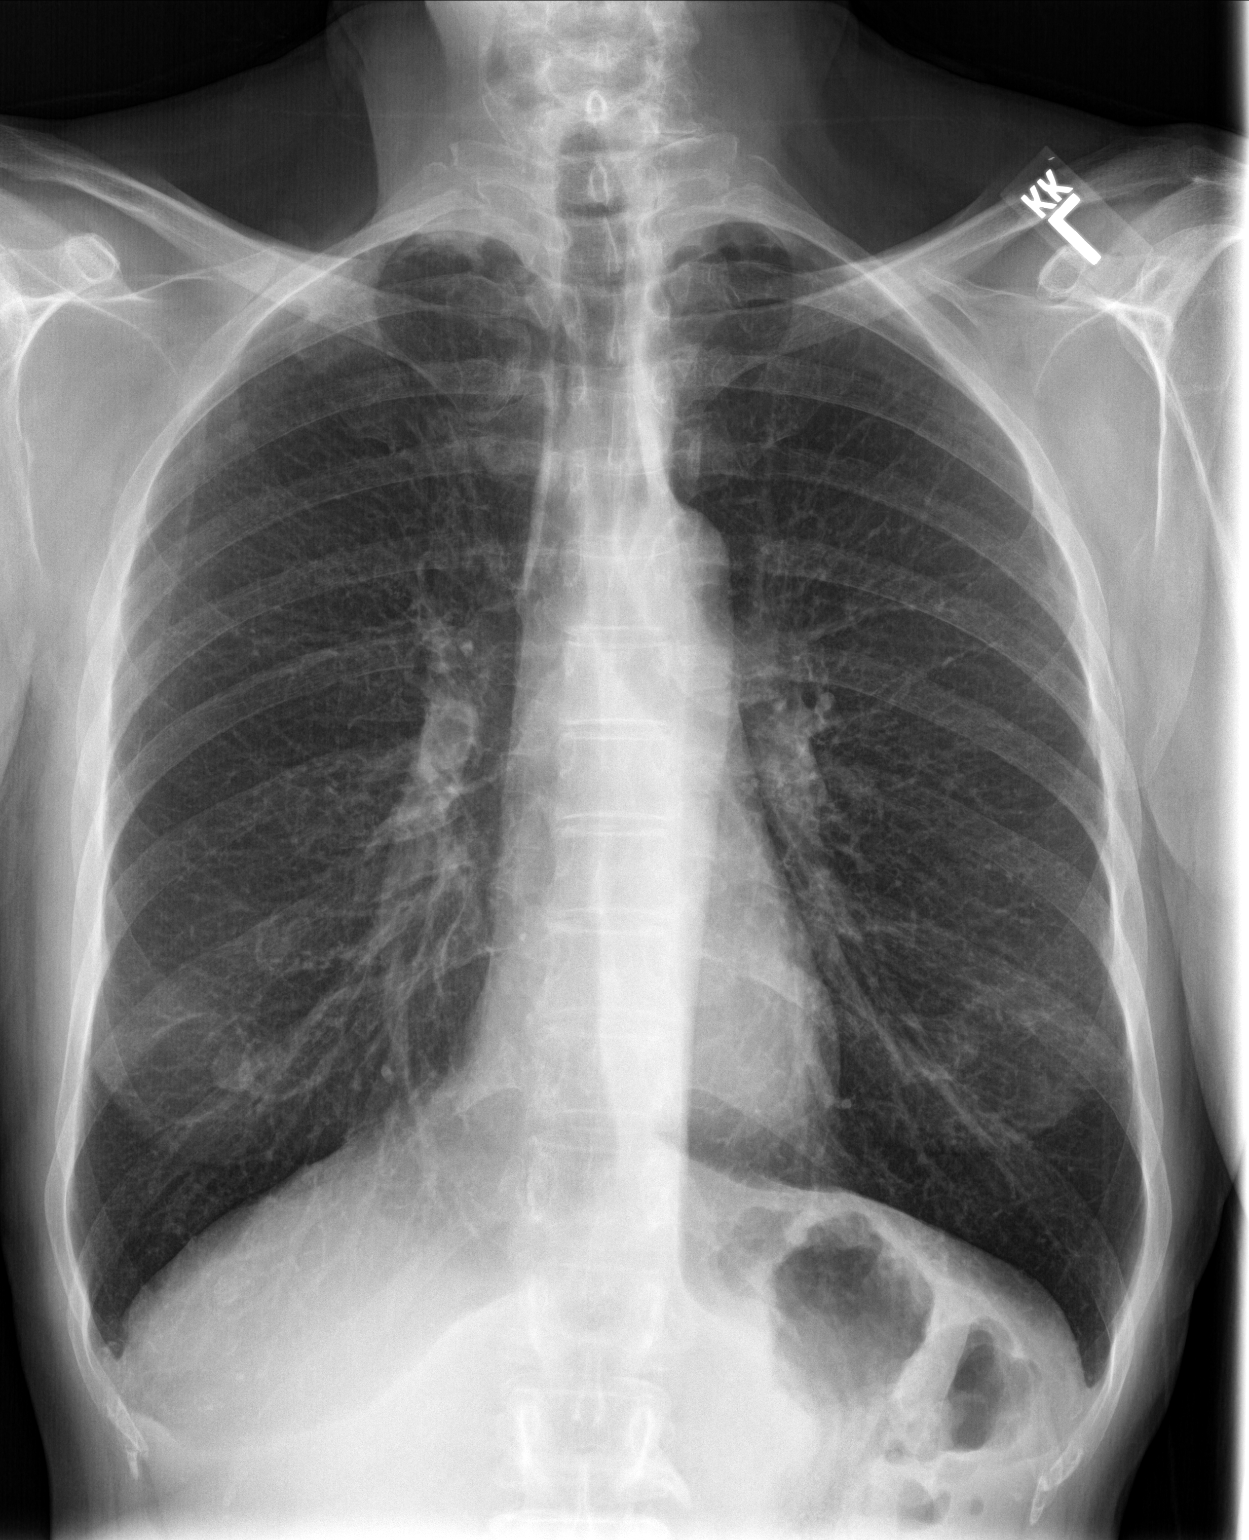

[chest lat]
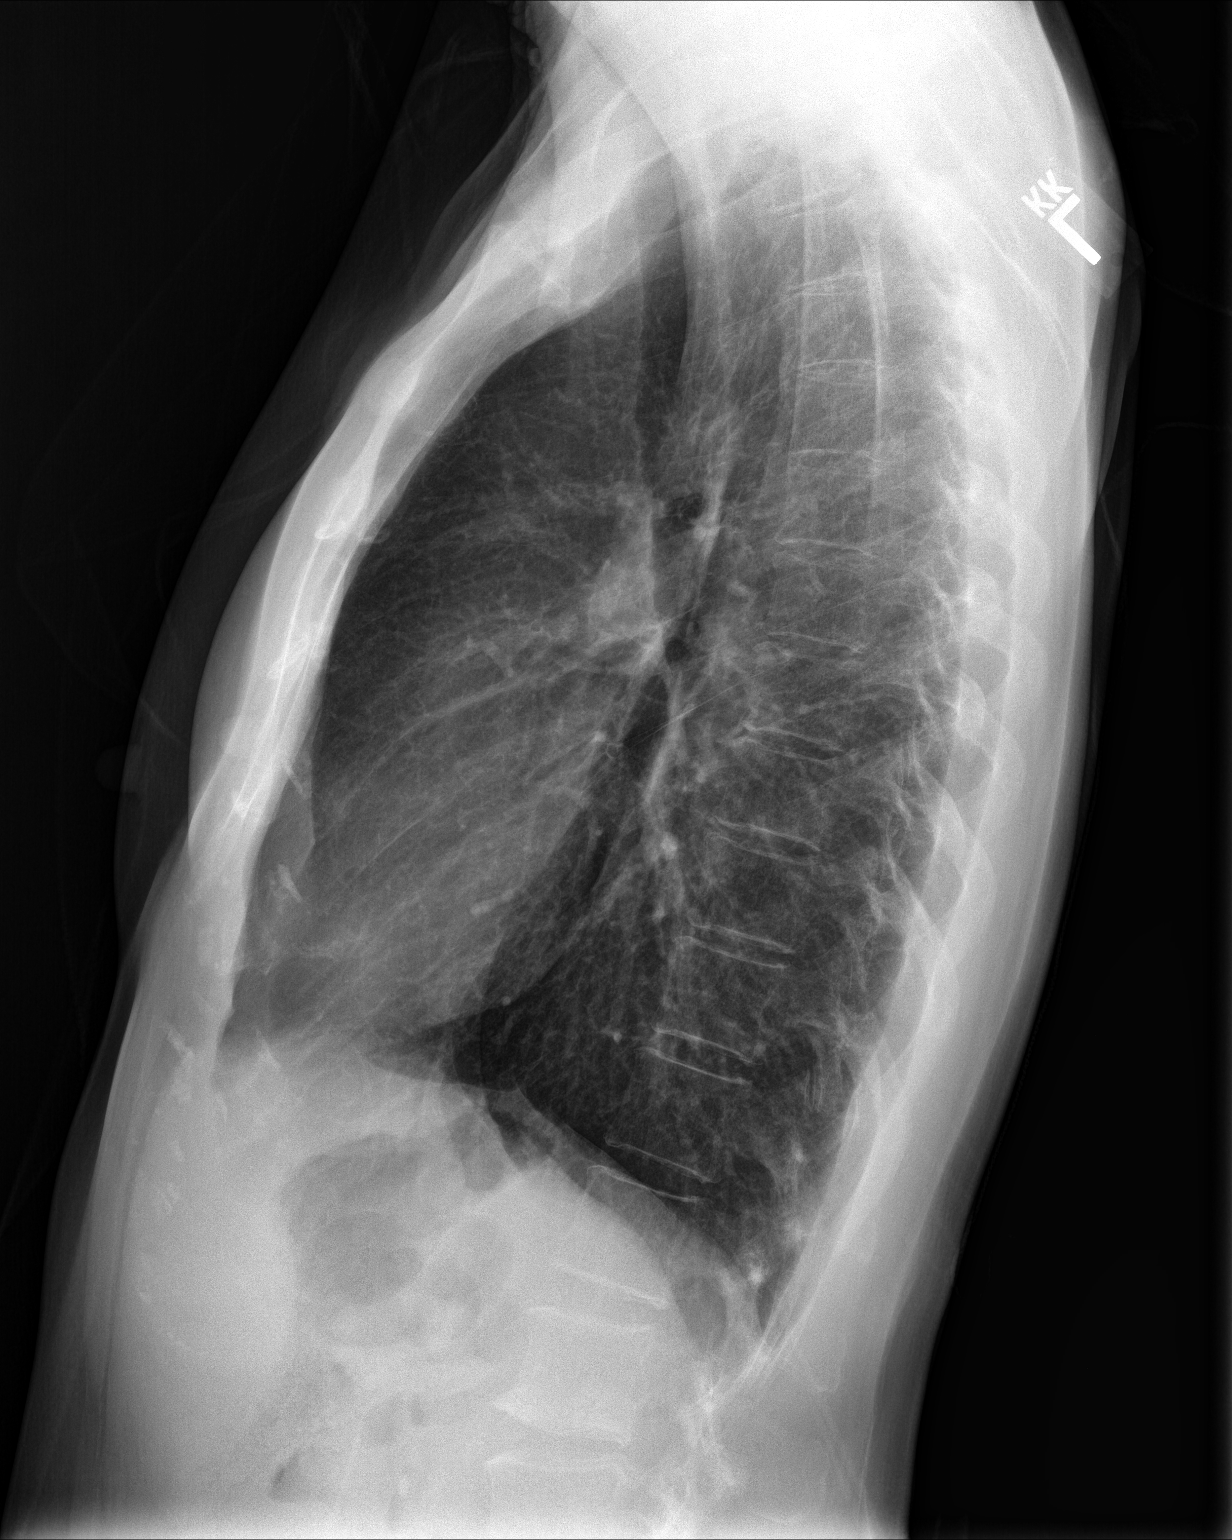

[2 of 2 positions shown; findings below may reference images not displayed]

FINDINGS: Normal heart size. Normal mediastinal contour. No pneumothorax. No
pleural effusion. Mild symmetric biapical pleural-parenchymal
scarring. No pulmonary edema. No acute consolidative airspace
disease.
IMPRESSION: No active cardiopulmonary disease.
